# Patient Record
Sex: Female | Born: 1990 | Race: White | Hispanic: No | Marital: Single
Health system: Southern US, Community
[De-identification: ages and names within clinical notes are randomized; demographics above are authoritative.]

---
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
## 2011-02-28 LAB — POCT RAPID STREP A (OFFICE): Streptococcus, Group A Screen (Direct): NEGATIVE

## 2012-04-12 IMAGING — CT CT HEAD W/O CM
3 series · 18 of 30 positions shown, 20 images · non-contrast
Comparison: CT brain scan of 12/07/2009

CLINICAL DATA: Hit head with door, headache, nausea, dizziness,
slightly blurred vision

CT HEAD WITHOUT CONTRAST
TECHNIQUE: Contiguous axial images were obtained from the base of
the skull through the vertex without contrast.

[Series 3: head bone · axial · 0.49mm/px · z∈[+19,+91]mm · 4 of 28 slices shown]
[im 5/28  bone]
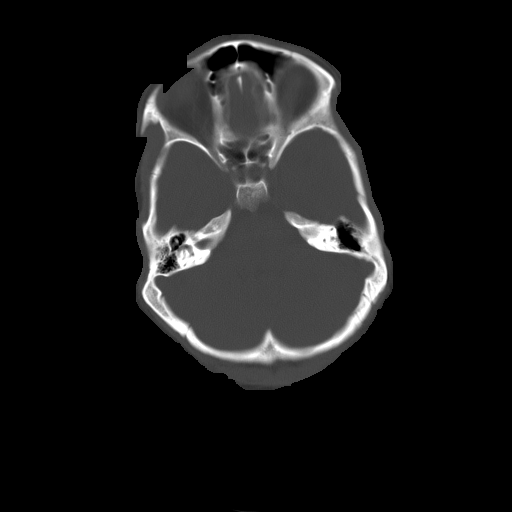
[im 10/28  bone]
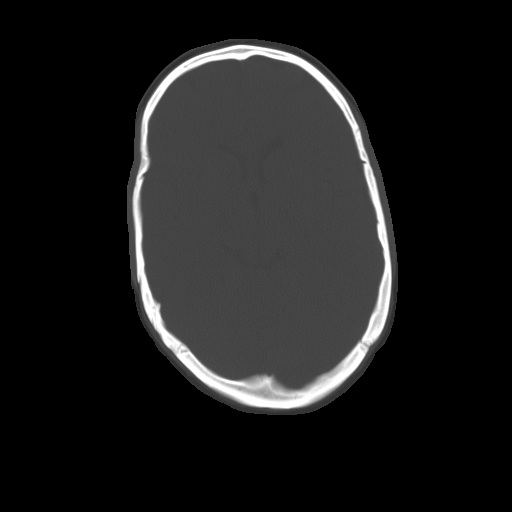
[im 14/28  bone]
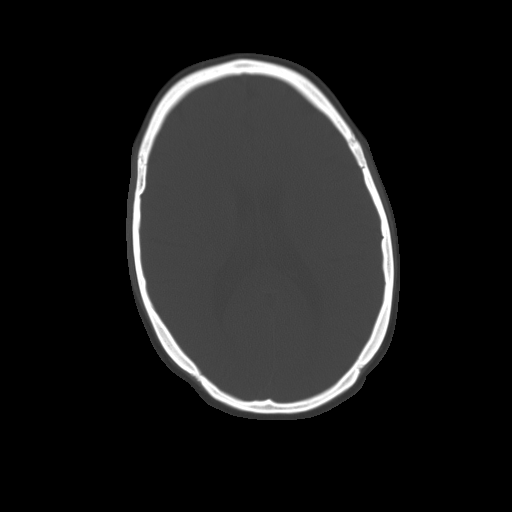
[im 19/28  bone]
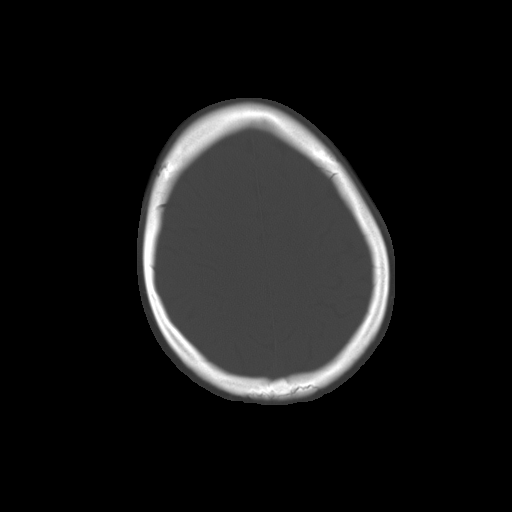

[Series 32: 3d filtered head w/o · axial · non-contrast · 0.49mm/px · z∈[+14,+116]mm · 6 of 28 slices shown, 8 images]
[im 4/28  brain]
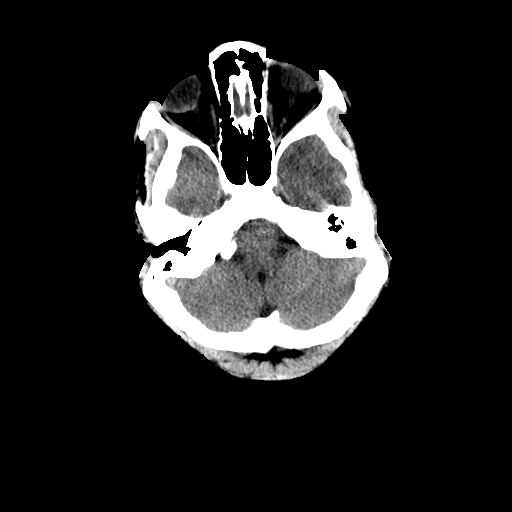
[im 4/28  bone]
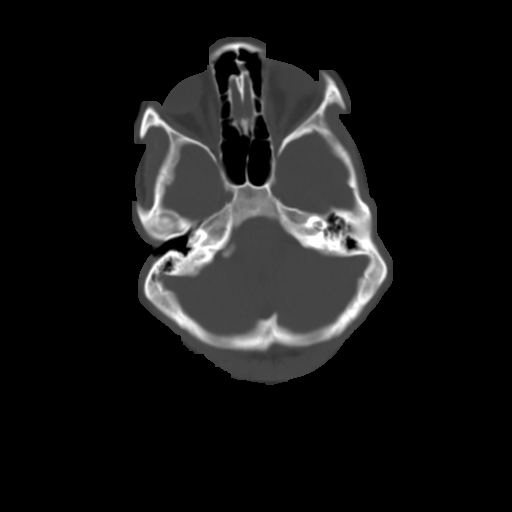
[im 8/28  brain]
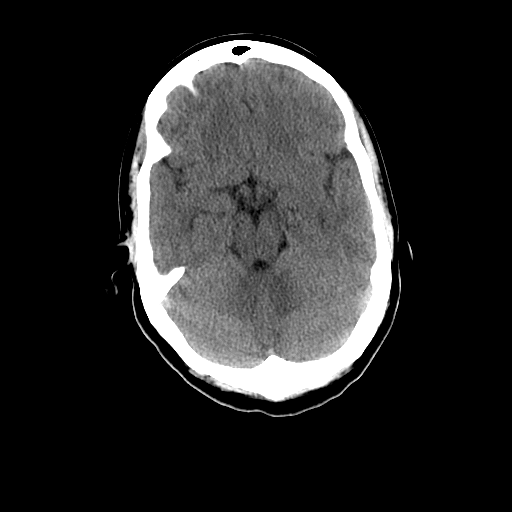
[im 12/28  brain]
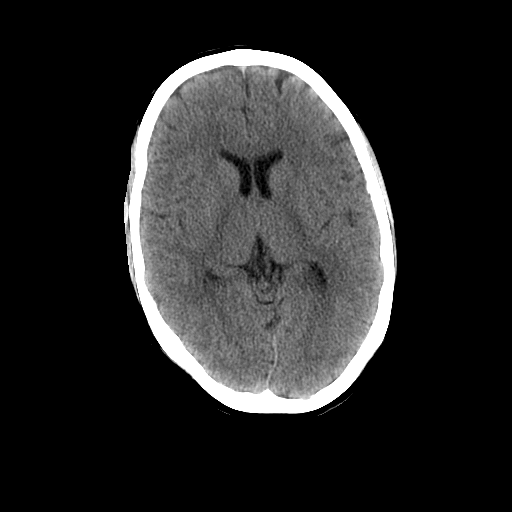
[im 16/28  brain]
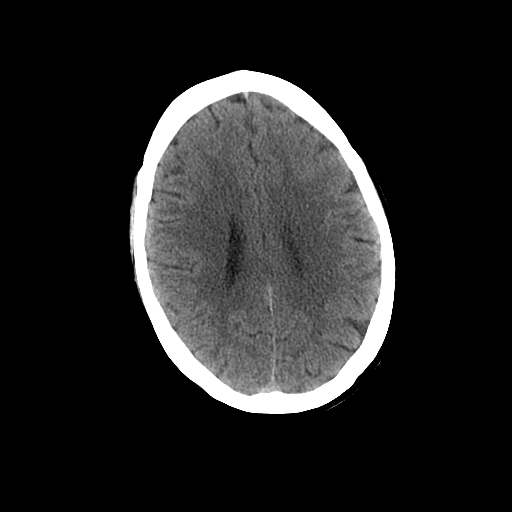
[im 20/28  brain]
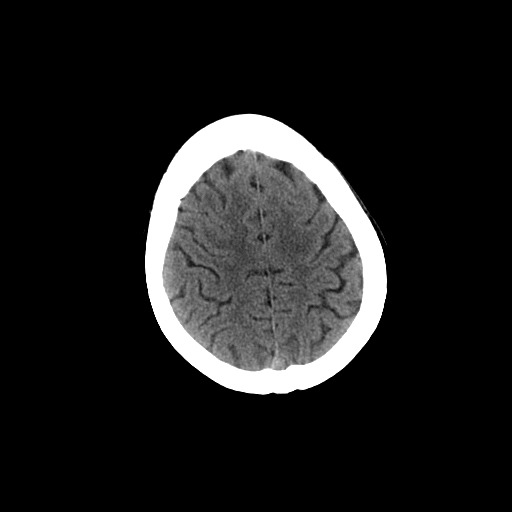
[im 20/28  bone]
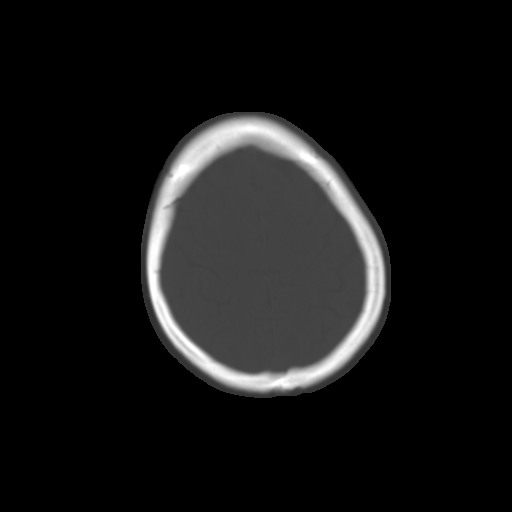
[im 24/28  brain]
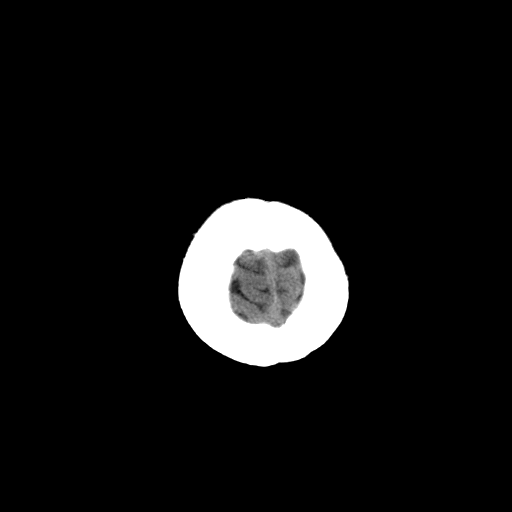

[Series 102: bone windows 2.5 · axial · 0.49mm/px · z∈[+5,+128]mm · 8 of 56 slices shown]
[im 4/56  bone]
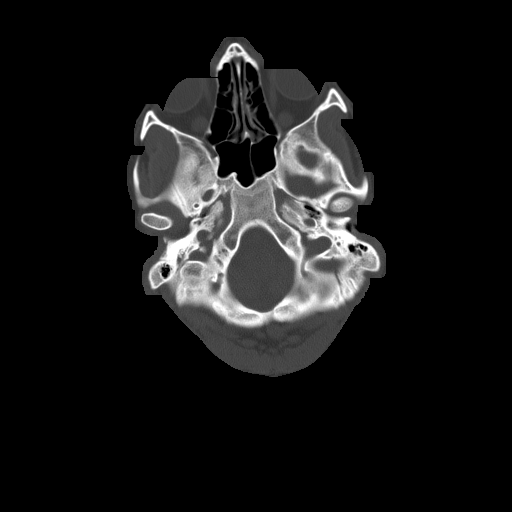
[im 12/56  bone]
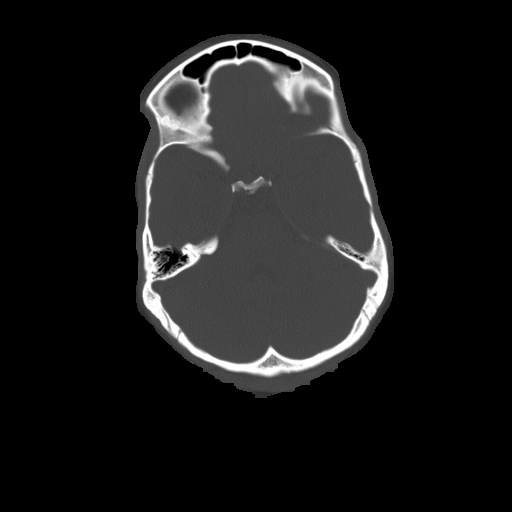
[im 20/56  bone]
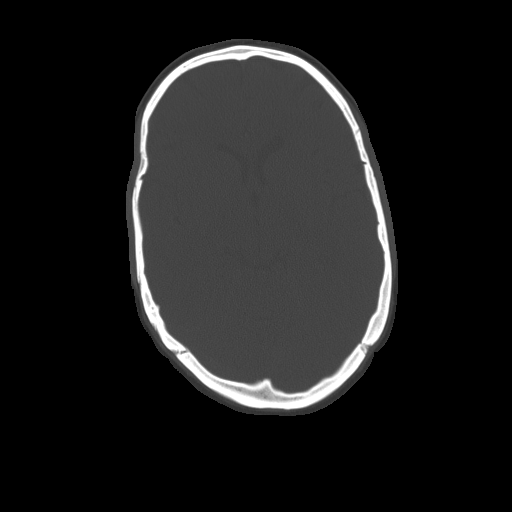
[im 24/56  bone]
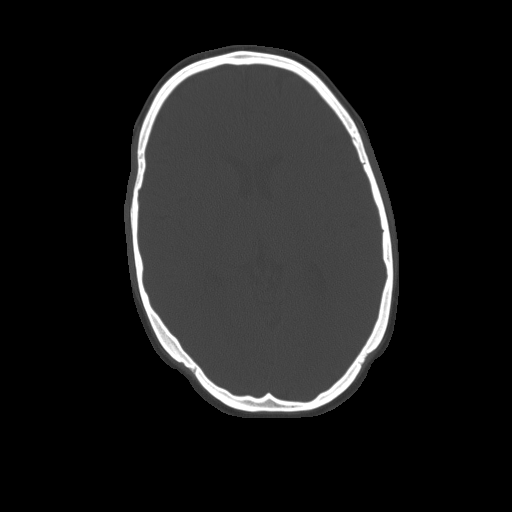
[im 32/56  bone]
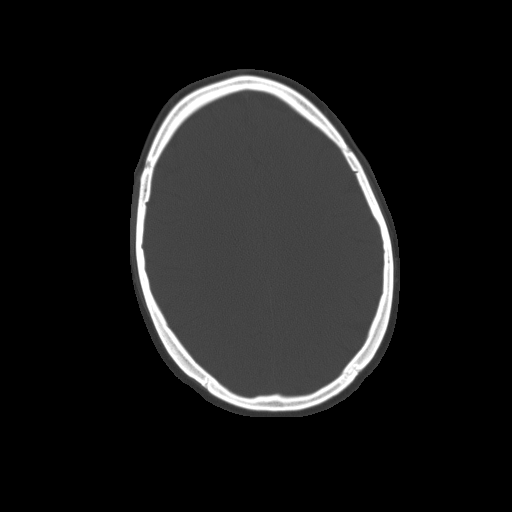
[im 36/56  bone]
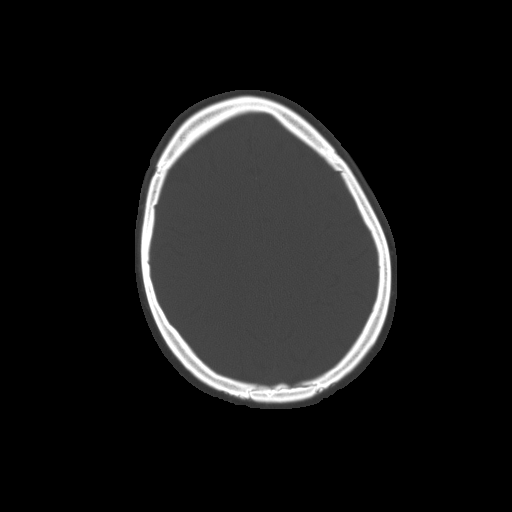
[im 44/56  bone]
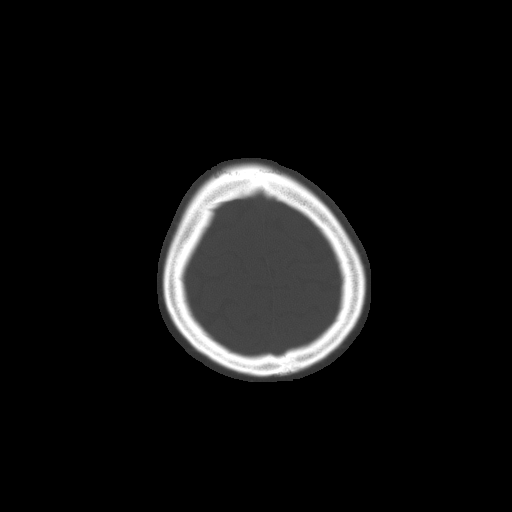
[im 52/56  bone]
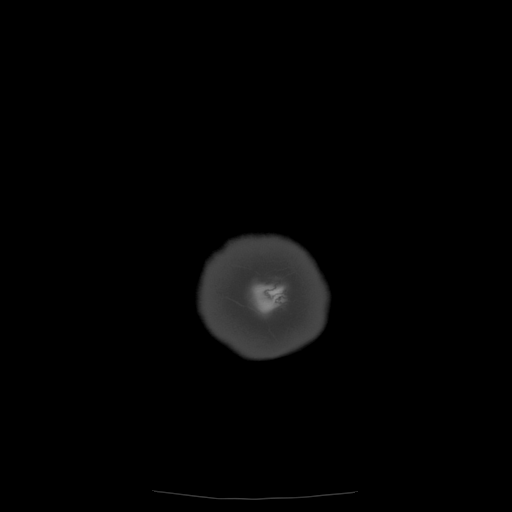

[18 of 30 positions shown; findings below may reference images not displayed]

FINDINGS: The ventricular system is normal in size and
configuration, and the septum is in a normal midline position.  The
fourth ventricle and basilar cisterns appear normal.  No
hemorrhage, mass lesion, or acute infarction is seen.  On bone
window images, no calvarial abnormality is seen.
IMPRESSION: Normal unenhanced CT of the brain.

## 2012-12-12 ENCOUNTER — Emergency Department (HOSPITAL_COMMUNITY)
Admission: EM | Admit: 2012-12-12 | Discharge: 2012-12-12 | Disposition: A | Discharge status: Home / Self Care | Payer: Self-pay | Source: Home / Self Care | Attending: Emergency Medicine | Admitting: Emergency Medicine

## 2012-12-12 ENCOUNTER — Encounter (HOSPITAL_COMMUNITY): Payer: Self-pay | Admitting: Emergency Medicine

## 2012-12-12 DIAGNOSIS — J45909 Unspecified asthma, uncomplicated: Secondary | ICD-10-CM

## 2012-12-12 MED ORDER — PREDNISONE 5 MG PO KIT: 1.0000 | PACK | Freq: Every day | ORAL | Status: AC

## 2012-12-12 MED ORDER — ALBUTEROL SULFATE HFA 108 (90 BASE) MCG/ACT IN AERS: 1.0000 | INHALATION_SPRAY | Freq: Four times a day (QID) | RESPIRATORY_TRACT | Status: AC | PRN

## 2012-12-12 MED ORDER — GUAIFENESIN-CODEINE 100-10 MG/5ML PO SYRP: 10.0000 mL | ORAL_SOLUTION | Freq: Four times a day (QID) | ORAL | Status: AC | PRN

## 2012-12-12 MED ORDER — AZITHROMYCIN 250 MG PO TABS: ORAL_TABLET | ORAL | Status: AC

## 2012-12-12 NOTE — ED Provider Notes (Signed)
Chief Complaint  Patient presents with  . URI    History of Present Illness:   The patient is a 21 year old female with a three-week history of cough productive yellow-green sputum, wheezing and tightness in her chest, postnasal drip, nasal congestion with yellow-green drainage with blood, headache, sinus pressure, ear pressure, and sore throat. The cough keeps her awake at night.  Review of Systems:  Other than noted above, the patient denies any of the following symptoms. Systemic:  No fever, chills, sweats, fatigue, myalgias, headache, or anorexia. Eye:  No redness, pain or drainage. ENT:  No earache, ear congestion, nasal congestion, sneezing, rhinorrhea, sinus pressure, sinus pain, post nasal drip, or sore throat. Lungs:  No cough, sputum production, wheezing, shortness of breath, or chest pain. GI:  No abdominal pain, nausea, vomiting, or diarrhea.  PMFSH:  Past medical history, family history, social history, meds, and allergies were reviewed.  Physical Exam:   Vital signs:  There were no vitals taken for this visit. General:  Alert, in no distress. Eye:  No conjunctival injection or drainage. Lids were normal. ENT:  TMs and canals were normal, without erythema or inflammation.  Nasal mucosa was clear and uncongested, without drainage.  Mucous membranes were moist.  Pharynx was clear, without exudate or drainage.  There were no oral ulcerations or lesions. Neck:  Supple, no adenopathy, tenderness or mass. Lungs:  No respiratory distress.  Lungs were clear to auscultation, without wheezes, rales or rhonchi.  Breath sounds were clear and equal bilaterally.  Heart:  Regular rhythm, without gallops, murmers or rubs. Skin:  Clear, warm, and dry, without rash or lesions.  Plan:   1.  The following meds were prescribed:   New Prescriptions   ALBUTEROL (PROVENTIL HFA;VENTOLIN HFA) 108 (90 BASE) MCG/ACT INHALER    Inhale 1-2 puffs into the lungs every 6 (six) hours as needed for wheezing.     AZITHROMYCIN (ZITHROMAX Z-PAK) 250 MG TABLET    Take as directed.   GUAIFENESIN-CODEINE (GUIATUSS AC) 100-10 MG/5ML SYRUP    Take 10 mLs by mouth 4 (four) times daily as needed for cough.   PREDNISONE 5 MG KIT    Take 1 kit (5 mg total) by mouth daily after breakfast. Prednisone 5 mg 6 day dosepack.  Take as directed.   2.  The patient was instructed in symptomatic care and handouts were given. 3.  The patient was told to return if becoming worse in any way, if no better in 3 or 4 days, and given some red flag symptoms that would indicate earlier return.   Reuben Likes, MD 12/12/12 [phone]

## 2012-12-12 NOTE — ED Notes (Signed)
C/o cold sx for three weeks.  Reports productive cough, chest congestion, and stuffy nose.  OTC medication tried but no relief.  Denies vomiting and only diarrhea in the beginning.

## 2014-02-20 ENCOUNTER — Emergency Department: Payer: Self-pay | Admitting: Emergency Medicine

## 2014-02-20 LAB — COMPREHENSIVE METABOLIC PANEL
ALT: 21 U/L (ref 12–78)
ANION GAP: 5 — AB (ref 7–16)
AST: 25 U/L (ref 15–37)
Albumin: 4.3 g/dL (ref 3.4–5.0)
Alkaline Phosphatase: 72 U/L
BILIRUBIN TOTAL: 0.5 mg/dL (ref 0.2–1.0)
BUN: 11 mg/dL (ref 7–18)
CO2: 26 mmol/L (ref 21–32)
Calcium, Total: 9.3 mg/dL (ref 8.5–10.1)
Chloride: 105 mmol/L (ref 98–107)
Creatinine: 0.73 mg/dL (ref 0.60–1.30)
EGFR (Non-African Amer.): >60
GLUCOSE: 107 mg/dL — AB (ref 65–99)
OSMOLALITY: 272 (ref 275–301)
POTASSIUM: 3.4 mmol/L — AB (ref 3.5–5.1)
SODIUM: 136 mmol/L (ref 136–145)
TOTAL PROTEIN: 8.9 g/dL — AB (ref 6.4–8.2)

## 2014-02-20 LAB — CBC WITH DIFFERENTIAL/PLATELET
Basophil #: 0.1 10*3/uL (ref 0.0–0.1)
Basophil %: 0.4 %
Eosinophil #: 0 10*3/uL (ref 0.0–0.7)
Eosinophil %: 0.3 %
HCT: 43.9 % (ref 35.0–47.0)
HGB: 15.3 g/dL (ref 12.0–16.0)
Lymphocyte #: 1.3 10*3/uL (ref 1.0–3.6)
Lymphocyte %: 9.9 %
MCH: 29.7 pg (ref 26.0–34.0)
MCHC: 34.8 g/dL (ref 32.0–36.0)
MCV: 85 fL (ref 80–100)
MONOS PCT: 9.8 %
Monocyte #: 1.2 x10 3/mm — ABNORMAL HIGH (ref 0.2–0.9)
NEUTROS PCT: 79.6 %
Neutrophil #: 10.2 10*3/uL — ABNORMAL HIGH (ref 1.4–6.5)
Platelet: 274 10*3/uL (ref 150–440)
RBC: 5.15 10*6/uL (ref 3.80–5.20)
RDW: 12.6 % (ref 11.5–14.5)
WBC: 12.8 10*3/uL — AB (ref 3.6–11.0)

## 2014-02-20 LAB — URINALYSIS, COMPLETE
BACTERIA: NONE SEEN
BLOOD: NEGATIVE
Bilirubin,UR: NEGATIVE
GLUCOSE, UR: NEGATIVE mg/dL (ref 0–75)
KETONE: NEGATIVE
Leukocyte Esterase: NEGATIVE
Nitrite: NEGATIVE
Ph: 6 (ref 4.5–8.0)
Specific Gravity: 1.023 (ref 1.003–1.030)

## 2014-02-20 LAB — LIPASE, BLOOD: Lipase: 298 U/L (ref 73–393)

## 2014-02-28 ENCOUNTER — Emergency Department: Payer: Self-pay | Admitting: Emergency Medicine

## 2014-08-18 ENCOUNTER — Emergency Department: Payer: Self-pay | Admitting: Emergency Medicine

## 2014-08-20 ENCOUNTER — Encounter: Payer: Self-pay | Admitting: General Surgery

## 2014-08-20 ENCOUNTER — Ambulatory Visit (INDEPENDENT_AMBULATORY_CARE_PROVIDER_SITE_OTHER): Payer: Self-pay | Admitting: General Surgery

## 2014-08-20 VITALS — BP 130/72 | HR 70 | Resp 14 | Ht 64.0 in | Wt 150.0 lb

## 2014-08-20 DIAGNOSIS — L732 Hidradenitis suppurativa: Secondary | ICD-10-CM

## 2014-08-20 NOTE — Patient Instructions (Addendum)
The patient is aware to use a heating pad as needed for comfort.Patient to return as needed. 

## 2014-08-20 NOTE — Progress Notes (Signed)
Patient ID: Rachel Powell, female   DOB: 06/21/1991, 23 y.o.   MRN: [id number]  Chief Complaint  Patient presents with  . Other    hidradenitis    HPI Rachel Powell is a 23 y.o. female here today for a evaluation of hidradenitis. Patient state she was seen in the ER on 08/18/14 and is currently take Bactrim two times daily. She states she have never had this problem before. The areas are draining and been there for one month.  HPI  No past medical history on file.  No past surgical history on file.  No family history on file.  Social History History  Substance Use Topics  . Smoking status: Not on file  . Smokeless tobacco: Never Used  . Alcohol Use: No    No Known Allergies  Current Outpatient Prescriptions  Medication Sig Dispense Refill  . sulfamethoxazole-trimethoprim (BACTRIM DS,SEPTRA DS) 800-160 MG per tablet Take 1 tablet by mouth 2 (two) times daily.       No current facility-administered medications for this visit.    Review of Systems Review of Systems  Constitutional: Negative.   Respiratory: Negative.   Cardiovascular: Negative.     Blood pressure 130/72, pulse 70, resp. rate 14, height  (1.626 m), weight 150 lb (68.04 kg), last menstrual period 07/21/2014.  Physical Exam Physical Exam  Constitutional: She is oriented to person, place, and time. She appears well-developed and well-nourished.  Eyes: Conjunctivae are normal. No scleral icterus.  Neck: Neck supple.  Cardiovascular: Normal rate, regular rhythm and normal heart sounds.   Pulmonary/Chest: Breath sounds normal.  Neurological: She is alert and oriented to person, place, and time.  Skin: Skin is warm and dry.  Right axilla 1.5 x 3 cm area of redness and drainage  Left axilla  Shows similar findings. Drainage evident with direct pressure.       Assessment    Hidradenitis     Plan    Local measures including cleansing, avoiding shaving and completing her present antibiotic  prescriptions were encouraged. Warm compresses to relieve swelling and inflammation were advised. She notify the office of her progress. Hopefully surgical excision will not be necessary.      REf. ER  Earline Mayotte 08/21/2014, 6:49 PM

## 2014-08-21 DIAGNOSIS — L732 Hidradenitis suppurativa: Secondary | ICD-10-CM | POA: Insufficient documentation

## 2014-08-24 LAB — WOUND CULTURE

## 2014-08-25 ENCOUNTER — Encounter (HOSPITAL_COMMUNITY): Payer: Self-pay | Admitting: Emergency Medicine

## 2014-10-19 ENCOUNTER — Encounter (HOSPITAL_COMMUNITY): Payer: Self-pay | Admitting: Emergency Medicine

## 2016-02-09 ENCOUNTER — Telehealth: Payer: Self-pay | Admitting: General Surgery

## 2016-02-09 NOTE — Telephone Encounter (Signed)
02-08-16 SPOKE WITH PT AND SHE WANTED TO MAKE AN APPT WITH DR BYRNETT.SHE WILL PAY THE $47.50 BD. SHE IS SELF PAY. WE ARE ABLE TO [facility] APPT & PT IS AWARE TO BRING $100-$200 FOR OFC APPT.ONCE SEEN THE TOTAL OF THE VISIT WOULD BE TOTALED & THEN A 55%DISCOUNT WOULD BE GIVEN.SHE WOULD BE RESPONCIBLE FOR THE REMAINDER/MTH (THE INFO WAS DISCUSSED WITH MAUREEN BYRNETT ([employer] MANAGER)/MTH

## 2016-03-13 DIAGNOSIS — L732 Hidradenitis suppurativa: Secondary | ICD-10-CM

## 2016-10-06 ENCOUNTER — Emergency Department
Admission: EM | Admit: 2016-10-06 | Discharge: 2016-10-06 | Disposition: A | Discharge status: Home / Self Care | Payer: Self-pay | Attending: Emergency Medicine | Admitting: Emergency Medicine

## 2016-10-06 ENCOUNTER — Encounter: Payer: Self-pay | Admitting: Emergency Medicine

## 2016-10-06 DIAGNOSIS — B9789 Other viral agents as the cause of diseases classified elsewhere: Secondary | ICD-10-CM

## 2016-10-06 DIAGNOSIS — J069 Acute upper respiratory infection, unspecified: Secondary | ICD-10-CM | POA: Insufficient documentation

## 2016-10-06 DIAGNOSIS — Z79899 Other long term (current) drug therapy: Secondary | ICD-10-CM | POA: Insufficient documentation

## 2016-10-06 MED ORDER — LORATADINE 10 MG PO TABS: 10.0000 mg | ORAL_TABLET | Freq: Every day | ORAL | 0 refills | Status: AC

## 2016-10-06 MED ORDER — FLUTICASONE PROPIONATE 50 MCG/ACT NA SUSP: 2.0000 | Freq: Every day | NASAL | 0 refills | Status: AC

## 2016-10-06 MED ORDER — PROMETHAZINE-DM 6.25-15 MG/5ML PO SYRP: 5.0000 mL | ORAL_SOLUTION | Freq: Four times a day (QID) | ORAL | 0 refills | Status: AC | PRN

## 2016-10-06 NOTE — ED Provider Notes (Signed)
[facility] Emergency Department Provider Note  ____________________________________________  Time seen: Approximately 2:22 PM  I have reviewed the triage vital signs and the nursing notes.   HISTORY  Chief Complaint Nasal Congestion    HPI Rachel Powell is a 25 y.o. female, NAD,  presents to the emergency department with four-day history of nasal congestion.  Has had nasal congestion, runny nose, postnasal drip and sore throat over the last 4 days. Has been taking over-the-counter DayQuil and NyQuil without relief of symptoms. Denies any fevers, chills, body aches. Has not had any chest pain, cough, chest congestion, abdominal pain, nausea or vomiting. States that her boyfriend has been sick with similar symptoms.    History reviewed. No pertinent past medical history.  Patient Active Problem List   Diagnosis Date Noted  . 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  Prior to Admission medications   Medication Sig Start Date End Date Taking? Authorizing Provider  albuterol (PROVENTIL HFA;VENTOLIN HFA) 108 (90 BASE) MCG/ACT inhaler Inhale 1-2 puffs into the lungs every 6 (six) hours as needed for wheezing. 12/12/12   Harden Mo, MD  azithromycin (ZITHROMAX Z-PAK) 250 MG tablet Take as directed. 12/12/12   Harden Mo, MD  fluticasone (FLONASE) 50 MCG/ACT nasal spray Place 2 sprays into both nostrils daily. 10/06/16   Sheliah Fiorillo L Phuc Kluttz, PA-C  guaiFENesin-codeine (GUIATUSS AC) 100-10 MG/5ML syrup Take 10 mLs by mouth 4 (four) times daily as needed for cough. 12/12/12   Harden Mo, MD  loratadine (CLARITIN) 10 MG tablet Take 1 tablet (10 mg total) by mouth daily. 10/06/16   Marlisa Caridi L Kasheem Toner, PA-C  PredniSONE 5 MG KIT Take 1 kit (5 mg total) by mouth daily after breakfast. Prednisone 5 mg 6 day dosepack.  Take as directed. 12/12/12   Harden Mo, MD  promethazine-dextromethorphan (PROMETHAZINE-DM) 6.25-15 MG/5ML syrup Take 5  mLs by mouth 4 (four) times daily as needed for cough. 10/06/16   Demarkis Gheen L Aliza Moret, PA-C  sulfamethoxazole-trimethoprim (BACTRIM DS,SEPTRA DS) 800-160 MG per tablet Take 1 tablet by mouth 2 (two) times daily.    Historical Provider, MD    Allergies Review of patient's allergies indicates no known allergies.  No family history on file.  Social History Social History  Substance Use Topics  . Smoking status: Not on file  . Smokeless tobacco: Never Used  . Alcohol use No     Review of Systems Constitutional: Negative for fever, chills. ENT: Positive nasal congestion, runny nose, sore throat, postnasal drip. No ear pain, ear discharge Cardiovascular: No chest pain. Respiratory: Positive cough, chest congestion. No shortness of breath or wheezing.  Gastrointestinal: No abdominal pain.  No nausea, vomiting.  Musculoskeletal: Negative for general myalgias.  Skin: Negative for rash. Neurological: Negative for headaches 10-point ROS otherwise negative.  ____________________________________________   PHYSICAL EXAM:  VITAL SIGNS: ED Triage Vitals [10/06/16 1312]  Enc Vitals Group     BP (!) 158/99     Pulse Rate 84     Resp 18     Temp 98 F (36.7 C)     Temp Source Oral     SpO2 98 %     Weight 160 lb (72.6 kg)     Height 5' 4" (1.626 m)     Head Circumference      Peak Flow      Pain Score 6     Pain Loc      Pain Edu?  Excl. in [place]?     Constitutional: Alert and oriented. Well appearing and in mild acute distress. Eyes: Conjunctivae are normal. Head: Atraumatic. ENT:      Ears: TMs his eyes bilaterally with mild serous effusion but no erythema, bulging, perforation.      Nose: Moderate congestion with moderate clear rhinorrhea. Turbinates are injected.      Mouth/Throat: Mucous membranes are moist.  Pharynx without erythema, swelling, exudate. Bilateral tonsils with mild swelling but no erythema or exudate. Uvula is midline. Airways patent. Neck:  Supple with full  range of motion Hematological/Lymphatic/Immunilogical: No cervical lymphadenopathy. Cardiovascular: Normal rate, regular rhythm. Normal S1 and S2.  Good peripheral circulation. Respiratory: Normal respiratory effort without tachypnea or retractions.  Lungs with congestion heard from the upper airway that is cleared with cough. Breath sounds noted in all lung fields. No wheeze or rales. Neurologic:  Normal speech and language. No gross focal neurologic deficits are appreciated.  Skin:  Skin is warm, dry and intact. No rash noted. Psychiatric: Mood and affect are normal. Speech and behavior are normal. Patient exhibits appropriate insight and judgement.   ____________________________________________   LABS  None ____________________________________________  EKG  None ____________________________________________  RADIOLOGY  None ____________________________________________    PROCEDURES  Procedure(s) performed: None   Procedures   Medications - No data to display   ____________________________________________   INITIAL IMPRESSION / ASSESSMENT AND PLAN / ED COURSE  Pertinent labs & imaging results that were available during my care of the patient were reviewed by me and considered in my medical decision making (see chart for details).  Clinical Course    Patient's diagnosis is consistent with Viral URI with cough. Patient will be discharged home with prescriptions for promethazine DM cough syrup, loratadine and Flonase to take as directed. May take over-the-counter Tylenol or ibuprofen as needed. Patient is to follow up with [facility] if symptoms persist past this treatment course. Patient is given ED precautions to return to the ED for any worsening or new symptoms.    ____________________________________________  FINAL CLINICAL IMPRESSION(S) / ED DIAGNOSES  Final diagnoses:  Viral URI with cough      NEW MEDICATIONS STARTED DURING THIS  VISIT:  Discharge Medication List as of 10/06/2016  2:50 PM    START taking these medications   Details  fluticasone (FLONASE) 50 MCG/ACT nasal spray Place 2 sprays into both nostrils daily., Starting Fri 10/06/2016, Print    loratadine (CLARITIN) 10 MG tablet Take 1 tablet (10 mg total) by mouth daily., Starting Fri 10/06/2016, Print    promethazine-dextromethorphan (PROMETHAZINE-DM) 6.25-15 MG/5ML syrup Take 5 mLs by mouth 4 (four) times daily as needed for cough., Starting Fri 10/06/2016, Print            Braxton Feathers, PA-C 10/06/16 1704    Rudene Re, MD 10/07/16 1002

## 2016-10-06 NOTE — ED Triage Notes (Signed)
Pt reports sinus and chest congestion, reports greenish yellow, x3 days. Reports she's been taking dayquil and nyquil with no relief.
